# Patient Record
Sex: Male | Born: 1935 | Race: Black or African American | Hispanic: No | Marital: Married | Smoking: Never smoker
Health system: Southern US, Community
[De-identification: ages and names within clinical notes are randomized; demographics above are authoritative.]

## PROBLEM LIST (undated history)

## (undated) DIAGNOSIS — I10 Essential (primary) hypertension: Secondary | ICD-10-CM

## (undated) DIAGNOSIS — E7800 Pure hypercholesterolemia, unspecified: Secondary | ICD-10-CM

## (undated) HISTORY — PX: FOOT SURGERY: SHX648

## (undated) HISTORY — DX: Essential (primary) hypertension: I10

## (undated) HISTORY — DX: Pure hypercholesterolemia, unspecified: E78.00

---

## 2004-04-02 ENCOUNTER — Ambulatory Visit: Payer: Self-pay | Admitting: Unknown Physician Specialty

## 2004-10-09 ENCOUNTER — Ambulatory Visit: Payer: Self-pay | Admitting: Internal Medicine

## 2006-11-12 ENCOUNTER — Ambulatory Visit: Payer: Self-pay | Admitting: Internal Medicine

## 2008-04-09 ENCOUNTER — Ambulatory Visit: Payer: Self-pay | Admitting: Urology

## 2009-07-29 ENCOUNTER — Ambulatory Visit: Payer: Self-pay | Admitting: Unknown Physician Specialty

## 2009-07-30 ENCOUNTER — Ambulatory Visit: Payer: Self-pay | Admitting: Unknown Physician Specialty

## 2009-07-31 ENCOUNTER — Ambulatory Visit: Payer: Self-pay | Admitting: Cardiovascular Disease

## 2012-10-06 ENCOUNTER — Emergency Department: Payer: Self-pay | Admitting: Emergency Medicine

## 2012-10-06 LAB — CBC
HCT: 38 % — ABNORMAL LOW (ref 40.0–52.0)
MCH: 27.1 pg (ref 26.0–34.0)
MCHC: 33.3 g/dL (ref 32.0–36.0)
MCV: 81 fL (ref 80–100)
Platelet: 187 10*3/uL (ref 150–440)
RBC: 4.66 10*6/uL (ref 4.40–5.90)
RDW: 14.4 % (ref 11.5–14.5)
WBC: 5 10*3/uL (ref 3.8–10.6)

## 2012-10-06 LAB — BASIC METABOLIC PANEL
Co2: 28 mmol/L (ref 21–32)
Glucose: 106 mg/dL — ABNORMAL HIGH (ref 65–99)
Osmolality: 274 (ref 275–301)
Sodium: 136 mmol/L (ref 136–145)

## 2012-10-06 LAB — TROPONIN I: Troponin-I: <0.02 ng/mL

## 2012-10-06 LAB — CK TOTAL AND CKMB (NOT AT ARMC)
CK, Total: 418 U/L — ABNORMAL HIGH (ref 35–232)
CK-MB: 3.3 ng/mL (ref 0.5–3.6)

## 2013-05-31 ENCOUNTER — Ambulatory Visit (INDEPENDENT_AMBULATORY_CARE_PROVIDER_SITE_OTHER): Payer: Medicare Other

## 2013-05-31 ENCOUNTER — Encounter: Payer: Self-pay | Admitting: Podiatry

## 2013-05-31 ENCOUNTER — Ambulatory Visit (INDEPENDENT_AMBULATORY_CARE_PROVIDER_SITE_OTHER): Payer: Medicare Other | Admitting: Podiatry

## 2013-05-31 VITALS — Resp 16 | Ht 71.0 in | Wt 220.0 lb

## 2013-05-31 DIAGNOSIS — M79609 Pain in unspecified limb: Secondary | ICD-10-CM

## 2013-05-31 DIAGNOSIS — M79673 Pain in unspecified foot: Secondary | ICD-10-CM

## 2013-05-31 DIAGNOSIS — M2040 Other hammer toe(s) (acquired), unspecified foot: Secondary | ICD-10-CM

## 2013-05-31 NOTE — Progress Notes (Signed)
   Subjective:    Patient ID: Charles Shelton, male    DOB: 03/14/1934, 79 y.o.   MRN: [id number]  HPI Comments: The pain on my little toe on my right foot is killing me. Its been hurting for 6 or so months. It came up slowly and now its getting worse. i used a corn pad and it didn't do any good.   Foot Pain      Review of Systems  Gastrointestinal: Positive for constipation.  All other systems reviewed and are negative.      Objective:   Physical Exam: I have reviewed his past medical history medications allergies surgeries social history and review of systems. Pulses are strongly palpable bilateral. Neurologic sensorium is intact per since once the monofilament deep tendon reflexes are intact and brisk bilateral. Muscle strength is 5 over 5 dorsiflexors plantar flexors inverters everters all intrinsic musculature is intact. Orthopedic evaluation demonstrates all joints distal to the ankle a full range of motion without crepitus. He has adductovarus rotated hammertoe deformity fifth bilateral. Cutaneous evaluation demonstrates supple well hydrated cutis with exception of her reactive hyperkeratotic lesion/porokeratosis lateral aspect of the fifth digit of the right foot. Radiographic evaluation does demonstrate a small exostosis to the distal phalanx laterally. This is more than likely responsible for his symptomatology.        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  Plan: Debrided reactive hyperkeratosis and placed padding fifth digit right foot. Discussed the possible need for derotational arthroplasty fifth digit right foot with lateral exostectomy.

## 2013-07-19 ENCOUNTER — Encounter: Payer: Self-pay | Admitting: Podiatry

## 2013-07-19 ENCOUNTER — Ambulatory Visit (INDEPENDENT_AMBULATORY_CARE_PROVIDER_SITE_OTHER): Payer: Medicare Other | Admitting: Podiatry

## 2013-07-19 DIAGNOSIS — M2040 Other hammer toe(s) (acquired), unspecified foot: Secondary | ICD-10-CM

## 2013-07-19 DIAGNOSIS — Q828 Other specified congenital malformations of skin: Secondary | ICD-10-CM

## 2013-07-19 NOTE — Progress Notes (Signed)
Malachai presents today chief complaint of a painful fifth digit of the right foot.  Objective: Pulses are palpable right foot. Adductovarus rotated hammertoe deformity with lateral porokeratotic lesion DIPJ right foot. No infection noted.  Assessment: Adductovarus rotated hammertoe deformity with exostosis lateral aspect resulting in porokeratotic lesion.  Plan: Debridement of reactive hyperkeratotic lesion placed padding discussed surgical intervention with him we'll followup with him in November for consult.

## 2013-12-11 ENCOUNTER — Ambulatory Visit (INDEPENDENT_AMBULATORY_CARE_PROVIDER_SITE_OTHER): Payer: Medicare Other | Admitting: Podiatry

## 2013-12-11 VITALS — BP 133/72 | HR 65 | Resp 16

## 2013-12-11 DIAGNOSIS — M2041 Other hammer toe(s) (acquired), right foot: Secondary | ICD-10-CM

## 2013-12-11 DIAGNOSIS — Q828 Other specified congenital malformations of skin: Secondary | ICD-10-CM

## 2013-12-11 NOTE — Progress Notes (Signed)
He presents today complaining of a painful lesion to the fifth digit of the right foot. He states that he would like to see about getting this surgically corrected at all possible. He denies any changes in his past medical history medications allergies or social history.  Objective: Vital signs are stable he is alert and oriented 3. Pulses are strongly palpable. Neurologic sensorium intact. Adductovarus rotated hammertoe deformity fifth right with distal lateral exostosis resulting in a porokeratotic lesion laterally.  Assessment: Adductovarus rotated hammertoe deformity with lateral exostosis.  Plan: Discussed etiology pathology conservative versus surgical therapies. Debrided reactive hyperkeratosis for him today. I consented him for a derotational arthroplasty fifth digit right foot with lateral exostectomy fifth right. I answered all of the questions regarding this procedure to the best of my ability in layman's terms. He understood it was amenable to it and signed all 3 pages of the consent form. We did discuss the possible postop complications which may include but are not limited to postop pain bleeding swelling infection and recurrence. I will follow-up with him after Thanksgiving.

## 2014-01-11 ENCOUNTER — Other Ambulatory Visit: Payer: Self-pay | Admitting: Podiatry

## 2014-01-11 MED ORDER — CEPHALEXIN 500 MG PO CAPS: 500.0000 mg | ORAL_CAPSULE | Freq: Three times a day (TID) | ORAL | Status: DC

## 2014-01-11 MED ORDER — PROMETHAZINE HCL 25 MG PO TABS: 25.0000 mg | ORAL_TABLET | Freq: Three times a day (TID) | ORAL | Status: DC | PRN

## 2014-01-11 MED ORDER — OXYCODONE-ACETAMINOPHEN 10-325 MG PO TABS: ORAL_TABLET | ORAL | Status: DC

## 2014-01-12 ENCOUNTER — Encounter: Payer: Self-pay | Admitting: Podiatry

## 2014-01-12 DIAGNOSIS — M2570 Osteophyte, unspecified joint: Secondary | ICD-10-CM

## 2014-01-12 DIAGNOSIS — M2041 Other hammer toe(s) (acquired), right foot: Secondary | ICD-10-CM

## 2014-01-17 ENCOUNTER — Ambulatory Visit (INDEPENDENT_AMBULATORY_CARE_PROVIDER_SITE_OTHER): Payer: Medicare Other | Admitting: Podiatry

## 2014-01-17 ENCOUNTER — Ambulatory Visit (INDEPENDENT_AMBULATORY_CARE_PROVIDER_SITE_OTHER): Payer: Medicare Other

## 2014-01-17 VITALS — BP 140/81 | HR 64 | Resp 16

## 2014-01-17 DIAGNOSIS — M2041 Other hammer toe(s) (acquired), right foot: Secondary | ICD-10-CM

## 2014-01-17 DIAGNOSIS — Z9889 Other specified postprocedural states: Secondary | ICD-10-CM

## 2014-01-17 NOTE — Progress Notes (Signed)
Charles Shelton presents today for fu surgical toe fifth right.  Denies fever chills nausea and vomitting.  Having very little pain.  Objective:  Vitals stable.  Dressing intact. Once removed demonstrates no erythema, no edema and no drainage. Radiographs demonstrate complete arthroplasty 5th toe at pipj  Assessment: well healing surgical toe fifth right x one week  Plan:  Redress today and followup one week with Dr Ardelle AntonWagoner for suture removal.

## 2014-01-25 ENCOUNTER — Ambulatory Visit (INDEPENDENT_AMBULATORY_CARE_PROVIDER_SITE_OTHER): Payer: Medicare Other | Admitting: Podiatry

## 2014-01-25 ENCOUNTER — Encounter: Payer: Self-pay | Admitting: Podiatry

## 2014-01-25 VITALS — BP 117/64 | HR 66 | Resp 16

## 2014-01-25 DIAGNOSIS — Z9889 Other specified postprocedural states: Secondary | ICD-10-CM

## 2014-01-25 DIAGNOSIS — M2041 Other hammer toe(s) (acquired), right foot: Secondary | ICD-10-CM

## 2014-01-25 NOTE — Progress Notes (Signed)
Patient ID: Charles Shelton, male   DOB: 05/08/1934, 79 y.o.   MRN: [id number]  Subjective: 79 year old male returns the office today  For follow-up evaluation status post right foot fifth digit hammertoe repair. The patient has continued wearing the surgical shoe and he denies any pain at this time. He has no other complaints in no acute changes since last appointment. Denies any systemic complaints such as fevers, chills, nausea, vomiting.  Objective:  AAO 3, NAD  DP/PT pulses palpable,  CRT less than 3 seconds  Protective sensation intact with Simms Weinstein monofilament  Incision along the fifth digit is well coapted without any evidence of dehiscence and sutures are intact. There is no swelling erythema, edema, increase in warmth. There is no tenderness overlying the surgical site. There is no clinical signs of infection present.  No pain with calf compression, swelling, warmth, erythema.   Assessment:  79 year old male status post right fifth digit hammertoe repair,  Healing well.   Plan: - Treatment options were discussed the patient including alternatives, risks, complications. - Sutures removed without complications. Incision well coapted. - Discussed the patient that he is started to get the area wet however he keep incision clean  And he can apply anabolic ointment overlying the incision. - He can start to return to a regular sneaker as long as it is not pressing on the toe/incision as tolerated. If there is any discomfort to go back into the surgical shoe. - Showed the patient how to tape the toe - Follow-up in 2 weeks without her Hyatt or sooner if any palms should arise. In the meantime, call the office with any questions, concerns, change in symptoms.

## 2014-01-31 NOTE — Progress Notes (Signed)
Dr Al CorpusHyatt performed a right foot devotational arthroplasty 5th toe and exostectomy 5th toe right foot on 01/12/14

## 2014-02-12 ENCOUNTER — Encounter: Payer: Medicare Other | Admitting: Podiatry

## 2014-02-13 ENCOUNTER — Ambulatory Visit (INDEPENDENT_AMBULATORY_CARE_PROVIDER_SITE_OTHER): Payer: Medicare Other

## 2014-02-13 ENCOUNTER — Encounter: Payer: Self-pay | Admitting: Podiatry

## 2014-02-13 ENCOUNTER — Ambulatory Visit (INDEPENDENT_AMBULATORY_CARE_PROVIDER_SITE_OTHER): Payer: Medicare Other | Admitting: Podiatry

## 2014-02-13 VITALS — BP 144/80 | HR 60 | Resp 16

## 2014-02-13 DIAGNOSIS — Z9889 Other specified postprocedural states: Secondary | ICD-10-CM

## 2014-02-13 DIAGNOSIS — M2041 Other hammer toe(s) (acquired), right foot: Secondary | ICD-10-CM

## 2014-02-13 NOTE — Progress Notes (Signed)
Patient ID: Charles Shelton, male   DOB: 07/31/1934, 80 y.o.   MRN: [id number]  Subjective: 80 year old male returns to the office status post right 5th digit PIPJ arthroplasty. He states overall he is doing well. He has a tender area on the "bottom of the toe". He is able to wear regular shoes without any difficulty. Denies any systemic complaints such as fevers, chills, nausea, vomiting. No acute changes since last appointment, and no other complaints at this time.   Objective: AAO x3, NAD DP/PT pulses palpable bilaterally, CRT less than 3 seconds Protective sensation intact with Simms Weinstein monofilament Incision along the 5th digit on the right foot is well coapted without any dehiscence. There is no tenderness to the overlying the incision. On the distal lateral aspect of the right fifth digit there is a hyperkeratotic lesion in which subjective the patient has some discomfort. 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 Upon debridement there is no underlying ulceration or any clinical signs of infection. No other open lesions or pre-ulcerative lesions. There is no specific areas of pinpoint bony tenderness. There is slight edema overlying the right fifth digit. No pain with calf compression, swelling, warmth, erythema.  Assessment: 80 year old male status post right fifth digit PIPJ arthroplasty with hyperkeratotic lesion distal lateral digit  Plan: -All treatment options discussed with the patient including all alternatives, risks, complications.  -Hyperkeratotic lesion was sharply debrided without complications to patient comfort. 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 Directed to apply a small amount of anabolic ointment as well as a Band-Aid to the area. -Dispensed a small toe pad to wear if needed. -Discussed the patient he can slowly start to return to work. He will start out with half days  and increase his activity as tolerated. Discussed with him that if he has any increasing symptoms while increasing his activity to decrease the activity. - follow-up in one month with Dr. Al CorpusHyatt or sooner should any palms arise. In the meantime, patient encouraged to call the office with any questions, concerns, change in symptoms.

## 2014-02-16 ENCOUNTER — Observation Stay: Payer: Self-pay | Admitting: Internal Medicine

## 2014-02-16 LAB — CBC WITH DIFFERENTIAL/PLATELET
BASOS PCT: 0.5 %
Basophil #: 0 10*3/uL (ref 0.0–0.1)
EOS PCT: 0.6 %
Eosinophil #: 0 10*3/uL (ref 0.0–0.7)
HCT: 38.8 % — ABNORMAL LOW (ref 40.0–52.0)
HGB: 12.2 g/dL — ABNORMAL LOW (ref 13.0–18.0)
LYMPHS PCT: 19.6 %
Lymphocyte #: 1.5 10*3/uL (ref 1.0–3.6)
MCH: 26.5 pg (ref 26.0–34.0)
MCHC: 31.5 g/dL — ABNORMAL LOW (ref 32.0–36.0)
MCV: 84 fL (ref 80–100)
MONOS PCT: 6.9 %
Monocyte #: 0.5 x10 3/mm (ref 0.2–1.0)
NEUTROS PCT: 72.4 %
Neutrophil #: 5.7 10*3/uL (ref 1.4–6.5)
PLATELETS: 190 10*3/uL (ref 150–440)
RBC: 4.61 10*6/uL (ref 4.40–5.90)
RDW: 13.9 % (ref 11.5–14.5)
WBC: 7.9 10*3/uL (ref 3.8–10.6)

## 2014-02-16 LAB — BASIC METABOLIC PANEL
Anion Gap: 7 (ref 7–16)
BUN: 13 mg/dL (ref 7–18)
CALCIUM: 8.8 mg/dL (ref 8.5–10.1)
CHLORIDE: 102 mmol/L (ref 98–107)
CO2: 30 mmol/L (ref 21–32)
CREATININE: 1.22 mg/dL (ref 0.60–1.30)
EGFR (African American): >60
EGFR (Non-African Amer.): >60
Glucose: 154 mg/dL — ABNORMAL HIGH (ref 65–99)
Osmolality: 281 (ref 275–301)
POTASSIUM: 3.6 mmol/L (ref 3.5–5.1)
SODIUM: 139 mmol/L (ref 136–145)

## 2014-02-16 LAB — TROPONIN I: Troponin-I: <0.02 ng/mL

## 2014-02-17 LAB — CBC WITH DIFFERENTIAL/PLATELET
BASOS PCT: 0.4 %
Basophil #: 0 10*3/uL (ref 0.0–0.1)
EOS ABS: 0.1 10*3/uL (ref 0.0–0.7)
Eosinophil %: 1.8 %
HCT: 37.7 % — ABNORMAL LOW (ref 40.0–52.0)
HGB: 12.1 g/dL — AB (ref 13.0–18.0)
LYMPHS PCT: 46.8 %
Lymphocyte #: 2.7 10*3/uL (ref 1.0–3.6)
MCH: 26.9 pg (ref 26.0–34.0)
MCHC: 32.2 g/dL (ref 32.0–36.0)
MCV: 84 fL (ref 80–100)
MONOS PCT: 10.1 %
Monocyte #: 0.6 x10 3/mm (ref 0.2–1.0)
NEUTROS PCT: 40.9 %
Neutrophil #: 2.4 10*3/uL (ref 1.4–6.5)
Platelet: 169 10*3/uL (ref 150–440)
RBC: 4.51 10*6/uL (ref 4.40–5.90)
RDW: 13.9 % (ref 11.5–14.5)
WBC: 5.8 10*3/uL (ref 3.8–10.6)

## 2014-02-17 LAB — BASIC METABOLIC PANEL
ANION GAP: 4 — AB (ref 7–16)
BUN: 12 mg/dL (ref 7–18)
Calcium, Total: 8.9 mg/dL (ref 8.5–10.1)
Chloride: 104 mmol/L (ref 98–107)
Co2: 29 mmol/L (ref 21–32)
Creatinine: 1.08 mg/dL (ref 0.60–1.30)
EGFR (Non-African Amer.): >60
GLUCOSE: 102 mg/dL — AB (ref 65–99)
Osmolality: 274 (ref 275–301)
Potassium: 3.4 mmol/L — ABNORMAL LOW (ref 3.5–5.1)
Sodium: 137 mmol/L (ref 136–145)

## 2014-02-17 LAB — LIPID PANEL
Cholesterol: 166 mg/dL (ref 0–200)
HDL Cholesterol: 53 mg/dL (ref 40–60)
LDL CHOLESTEROL, CALC: 94 mg/dL (ref 0–100)
Triglycerides: 93 mg/dL (ref 0–200)
VLDL Cholesterol, Calc: 19 mg/dL (ref 5–40)

## 2014-02-17 LAB — TSH: Thyroid Stimulating Horm: 2.33 u[IU]/mL

## 2014-02-17 LAB — HEMOGLOBIN A1C: Hemoglobin A1C: 6.4 % — ABNORMAL HIGH (ref 4.2–6.3)

## 2014-03-12 ENCOUNTER — Ambulatory Visit (INDEPENDENT_AMBULATORY_CARE_PROVIDER_SITE_OTHER): Payer: Medicare Other

## 2014-03-12 ENCOUNTER — Encounter: Payer: Medicare Other | Admitting: Podiatry

## 2014-03-12 ENCOUNTER — Ambulatory Visit (INDEPENDENT_AMBULATORY_CARE_PROVIDER_SITE_OTHER): Payer: Medicare Other | Admitting: Podiatry

## 2014-03-12 DIAGNOSIS — M2041 Other hammer toe(s) (acquired), right foot: Secondary | ICD-10-CM

## 2014-03-12 NOTE — Progress Notes (Signed)
He presents today several weeks status post arthroplasty with exostectomy fifth toe right foot. He states that he feels pretty good to me. He denies fever chills nausea vomiting muscle aches and pains. States that the toe does not hurt with shoe gear.  Objective: Vital signs are stable he is alert and oriented 3. Pulses are strongly palpable. The toe is mildly edematous there is no erythema cellulitis drainage or odor. He has good range of motion of the toe and it is not painful on palpation radiographs confirm complete arthroplasty of the PIPJ fifth digit right foot without bony regrowth.  Assessment: Well-healing surgical toe fifth right.  Plan: Follow up with him on an as-needed basis.

## 2014-06-10 NOTE — H&P (Signed)
PATIENT NAME:  Charles Shelton, Charles Shelton MR#:  [id number] DATE OF BIRTH:  18-Jan-1935  DATE OF ADMISSION:  02/16/2014  PRIMARY CARE PHYSICIAN: Kandyce Rud, MD  REFERRING EMERGENCY ROOM PHYSICIAN: Cecille Amsterdam. Mayford Knife, MD  CHIEF COMPLAINT: Ataxia.   HISTORY OF PRESENT ILLNESS: The patient is a 80 year old African American male who came into the ED with a chief complaint of unsteady gait since yesterday evening. The patient was swaying while walking, and doing fine if he is resting. This started yesterday night and progressively has been getting worse. Denies any loss of consciousness or falls. No similar complaints in the past. Denies any headache, blurry vision, no weakness or tingling or numbness in her upper or lower extremities. Denies any swallowing difficulties or speech problems.   In the ED, CAT scan of the head was done, which was normal. The patient was given aspirin, and the hospitalist team was called to admit the patient to rule out TIA.   During my examination, the patient is resting comfortably with no complaints. He reports he is doing fine as long as he is staying in the bed.   PAST MEDICAL HISTORY: Hypertension, hyperlipidemia, obesity.   PAST SURGICAL HISTORY: Right toe surgery.   ALLERGIES: No known drug allergies.   PSYCHOSOCIAL HISTORY: Lives at home with wife. No smoking, alcohol or illicit drug usage.   FAMILY HISTORY: Hypertension runs in his family.   HOME MEDICATIONS:,: Potassium gluconate 1 tablet p.o. once daily, multivitamin 1 tablet p.o. once daily, hydrochlorothiazide 25 mg 1/2 tablet p.o. once daily, Diovan HCT 160/12.5 one tablet p.o. once daily, Crestor 5 mg p.o. once daily, aspirin 81 mg p.o. once daily.   REVIEW OF SYSTEMS:  CONSTITUTIONAL: Denies any fever.  EYES: Denies blurry vision or double vision.  EARS, NOSE, AND THROAT: Denies epistaxis, discharge.  RESPIRATORY: Denies cough or COPD.  CARDIOVASCULAR: No chest pain, palpitations, or syncope.   GASTROINTESTINAL: Denies nausea, vomiting, diarrhea, abdominal pain, hematemesis.  GENITOURINARY: No dysuria or hematuria.  ENDOCRINE: Denies polyuria, nocturia, thyroid problems.  HEMATOLOGIC AND LYMPHATIC: No anemia, easy bruising, or bleeding.  INTEGUMENT: Denies rash or lesions.  MUSCULOSKELETAL: No joint pain in the neck or back. Denies any shoulder pain.  NEUROLOGIC: Denies any vertigo or complaints of ataxia. No dysarthria. Denies any dementia. No CVA or TIA. PSYCHIATRIC: Normal mood and affect.   PHYSICAL EXAMINATION:  VITAL SIGNS:  Temperature 98.1, pulse 60, respirations 18, blood pressure 148/74, pulse oximetry 98% on 2 L.  GENERAL APPEARANCE: Not in acute distress. Moderately built and nourished.  HEENT: Normocephalic, atraumatic. Pupils are equally reacting to light and accommodation. No scleral icterus. No conjunctival injection. No sinus tenderness. No postnasal drip.  NECK: Supple. No JVD. No thyromegaly. Range of motion is intact.  LUNGS: Clear to auscultation bilaterally. No accessory muscle use. There is no anterior chest wall tenderness on palpation.  CARDIAC: S1 and S2 normal. Regular rate and rhythm. No murmurs.  GASTROINTESTINAL: Soft. Bowel sounds are positive in all 4 quadrants. Nontender, nondistended. No hepatosplenomegaly. No masses felt.  NEUROLOGIC: Awake, alert, oriented x 3. Cranial nerves II through XII are grossly intact. Motor and sensory are intact. Reflexes are 2+. Gait unsteady with ataxia. No pronator drift.  EXTREMITIES: No edema. No cyanosis. No clubbing.  SKIN: Warm. Normal turgor. No rashes. No lesions.  PSYCHIATRIC: Normal mood and affect.   LABORATORIES AND IMAGING STUDIES: CT head normal. Troponin less than 0.02. WBC 7.9, hemoglobin 12.2, hematocrit 38.8, platelets are 190,000. Glucose 154. The rest of the  BMP is normal. Twelve-lead EKG: Normal sinus rhythm at 65 beats per minute; no acute ST-T wave changes.   ASSESSMENT AND PLAN: This  80 year old African American male presented to the emergency department with a chief complaint of unsteady gait since yesterday. Denies any falls or loss of consciousness. Initial CT head is negative.   1.  Ataxia versus transient ischemic attack. We will admit the patient to medical telemetry under observation status. CAT scan of the head is negative. Will get stroke workup with carotid Dopplers, 2-D echocardiogram and MRI of the brain. The patient will be on aspirin and statin. He will get neurologic checks. Physical therapy evaluation will be done if necessary, per the neurology consult.  2.  Hypotension. Resume his home medications and up titrate on an as-needed basis. The patient will be on Diovan/HCT and hydrochlorothiazide.  3.  Hyperlipidemia. We will continue statin and check fasting lipid panel.  4.  Deep vein thrombosis prophylaxis with Lovenox subcutaneous.  5.  Obesity. Lifestyle changes were advised.   Diagnosis and plan of care were discussed in detail with the patient. He is aware of the plan.  TOTAL TIME SPENT ON THE ADMISSION: 50 minutes.   He is Full Code. Wife is the Medical Power of ByarsAttorney.   ____________________________ Ramonita LabAruna Vivi Piccirilli, MD ag:MT D: 02/16/2014 16:03:16 ET T: 02/16/2014 16:36:45 ET JOB#: [id number]  cc: Ramonita LabAruna Egan Sahlin, MD, <Dictator> Kandyce RudMarcus Babaoff, MD Ramonita LabARUNA Irlene Crudup MD ELECTRONICALLY SIGNED 02/24/2014 15:03

## 2014-06-10 NOTE — Discharge Summary (Signed)
PATIENT NAME:  Charles Shelton, Charles Shelton MR#:  [id number] DATE OF BIRTH:  01-17-1935  DATE OF ADMISSION:  02/16/2014 DATE OF DISCHARGE:  02/17/2014  ADMITTING DIAGNOSIS: Transient ischemic attack.  DISCHARGE DIAGNOSES: 1.  Ataxia, likely transient ischemic attack versus hypertensive encephalopathy, resolved. 2.  No stroke.  3.  Essential hypertension.  4.  Hyperlipidemia.  5.  Hyperglycemia with hemoglobin A1c of 6.4. 6.  Hyperlipidemia with LDL 94, total cholesterol 045, triglycerides 93, and HDL 53.  7.  Anemia.  8.  History of hypertension, hyperlipidemia, as well as obesity.   DISCHARGE CONDITION: Stable.   DISCHARGE MEDICATIONS: The patient is to continue Diovan HCT 160/12.5 mg p.o. daily; hydrochlorothiazide 25 mg p.o. half tablet daily; multivitamins once daily; potassium gluconate 595 mg once daily; Crestor 5 mg daily, this is a new dose; and aspirin 325 mg p.o. daily.   HOME OXYGEN: None.   DIET: Low-salt, low-fat, low-cholesterol, regular consistency.  ACTIVITY LIMITATIONS: As tolerated.  FOLLOWUP: Followup appointment with Dr. Larwance Sachs in 2 days after discharge.   CONSULTANTS: Case management, social work.   RADIOLOGIC STUDIES: MRI of brain with and without contrast and MRA of brain without contrast 02/16/2014 showed atrophy and small-vessel disease. No acute intracranial findings. Cervical spondylosis was not completely evaluated. If there are  signs and symptoms of myelopathy, consider MRI of cervical spine. Unremarkable MRA of intracranial arteries. CT scan of the head without contrast 02/16/2014 was negative for any abnormalities. Carotid ultrasound, bilateral Dopplers 02/16/2014 revealing no significant plaque or other abnormality. Echocardiogram 02/17/2014 revealing left ventricular ejection fraction by visual estimation of 55% to 60%, normal global left ventricular systolic function, mildly dilated left atrium as well as mildly dilated right atrium, mild mitral valve  regurgitation, mild tricuspid regurgitation.   HISTORY OF PRESENT ILLNESS: The patient is a 80 year old male who presented to the hospital with complaints of ataxia. Please refer to Dr. Rob Hickman admission note on 02/16/2014. On arrival to the hospital, the patient's temperature was 98.1, pulse was 60, respiration rate was 18, blood pressure 148/74, saturation was 98% on 2 L of oxygen through nasal cannula. Physical exam revealed gait unsteady with ataxia, but otherwise unremarkable. The patient's lab data done on arrival to the hospital showed elevated glucose level at 154; otherwise BMP was normal. Troponin was less than 0.02. TSH was normal at 2.33. CBC with a white blood cell count of 7.9, hemoglobin 12.2, platelet count 190,000. Absolute neutrophil count was normal at 5.7.  HOSPITAL COURSE: 1.  Patient was admitted to the hospital for further evaluation. Because of ataxia and concern of possible stroke or TIA, the patient was started on high dose of aspirin and a stroke workup was initiated with MRI as well as MRA of his brain and intracranial arteries. Carotid ultrasound was also performed, and echocardiogram. All of those studies did not show significant abnormalities. It was felt that patient's ataxia could have been related to a TIA versus hypertensive encephalopathy. It resolved by 02/17/2014. The patient was advised to continue aspirin therapy. His Crestor was advanced to 5 mg once daily dose. The patient is to continue to follow up with his primary care physician as well as neurologist as outpatient. In regard to essential hypertension, the patient is to continue his usual outpatient management. The patient's blood pressure was better controlled as time progressed. On the day of discharge, 02/17/2014, the patient's temperature was 98.4, pulse was 62, respiration rate was 17 to 18, blood pressure 120/71. Saturation was 96% on room air at  rest. It is recommended to follow the patient's blood pressure  readings as an outpatient, especially at home, and advance his blood pressure medications if needed. 2.  In regard to hyperlipidemia, as mentioned above, patient's lipid panel revealed LDL of 94. Due to his presentation with ataxia and concern of possible TIA, the patient's LDL should be below 100, preferably below 70. Patient's Crestor was advanced to once daily dose as opposed to 3 times weekly dose. 3.  In regard to hyperglycemia, the patient's hemoglobin A1c was checked and was found to be 6.4. The patient was recommended dietary consultation because of his prediabetic state. 4.  Regarding history of anemia, hypertension, obesity, the patient is to continue his outpatient management. No changes were made.  DISPOSITION: The patient is being discharged in stable condition with the above-mentioned medications and followup.   TIME SPENT: 40 minutes.   ____________________________ Katharina Caperima Jamarcus Laduke, MD rv:ST D: 02/17/2014 15:08:37 ET T: 02/18/2014 00:32:43 ET JOB#: [id number]  cc: Katharina Caperima Oseph Imburgia, MD, <Dictator> Kandyce RudMarcus Babaoff, MD Ranon Coven MD ELECTRONICALLY SIGNED 03/01/2014 9:18

## 2015-03-27 ENCOUNTER — Encounter
Admission: RE | Disposition: A | Discharge status: Home / Self Care | Payer: Self-pay | Source: Ambulatory Visit | Attending: Unknown Physician Specialty

## 2015-03-27 ENCOUNTER — Ambulatory Visit
Admission: RE | Admit: 2015-03-27 | Discharge: 2015-03-27 | Disposition: A | Discharge status: Home / Self Care | Payer: Medicare Other | Source: Ambulatory Visit | Attending: Unknown Physician Specialty | Admitting: Unknown Physician Specialty

## 2015-03-27 ENCOUNTER — Encounter: Payer: Self-pay | Admitting: *Deleted

## 2015-03-27 DIAGNOSIS — Z79899 Other long term (current) drug therapy: Secondary | ICD-10-CM | POA: Diagnosis not present

## 2015-03-27 DIAGNOSIS — Z1211 Encounter for screening for malignant neoplasm of colon: Secondary | ICD-10-CM | POA: Insufficient documentation

## 2015-03-27 DIAGNOSIS — I10 Essential (primary) hypertension: Secondary | ICD-10-CM | POA: Diagnosis not present

## 2015-03-27 DIAGNOSIS — Z7982 Long term (current) use of aspirin: Secondary | ICD-10-CM | POA: Diagnosis not present

## 2015-03-27 DIAGNOSIS — E7800 Pure hypercholesterolemia, unspecified: Secondary | ICD-10-CM | POA: Insufficient documentation

## 2015-03-27 DIAGNOSIS — K640 First degree hemorrhoids: Secondary | ICD-10-CM | POA: Insufficient documentation

## 2015-03-27 HISTORY — PX: COLONOSCOPY WITH PROPOFOL: SHX5780

## 2015-03-27 SURGERY — COLONOSCOPY WITH PROPOFOL
Anesthesia: General

## 2015-03-27 MED ORDER — MIDAZOLAM HCL 5 MG/5ML IJ SOLN
INTRAMUSCULAR | Status: AC
  Filled 2015-03-27: qty 10

## 2015-03-27 MED ORDER — FENTANYL CITRATE (PF) 100 MCG/2ML IJ SOLN
INTRAMUSCULAR | Status: DC | PRN
  Administered 2015-03-27 (×2): 25 ug via INTRAVENOUS
  Administered 2015-03-27: 50 ug via INTRAVENOUS

## 2015-03-27 MED ORDER — PROMETHAZINE HCL 25 MG/ML IJ SOLN
12.5000 mg | Freq: Once | INTRAMUSCULAR | Status: AC
  Administered 2015-03-27: 12.5 mg via INTRAVENOUS

## 2015-03-27 MED ORDER — SODIUM CHLORIDE 0.9 % IV SOLN
INTRAVENOUS | Status: DC
  Administered 2015-03-27: 1000 mL via INTRAVENOUS

## 2015-03-27 MED ORDER — FENTANYL CITRATE (PF) 100 MCG/2ML IJ SOLN
INTRAMUSCULAR | Status: AC
  Filled 2015-03-27: qty 4

## 2015-03-27 MED ORDER — SODIUM CHLORIDE 0.9 % IV SOLN: INTRAVENOUS | Status: DC

## 2015-03-27 MED ORDER — MIDAZOLAM HCL 5 MG/5ML IJ SOLN
INTRAMUSCULAR | Status: DC | PRN
  Administered 2015-03-27: 1 mg via INTRAVENOUS
  Administered 2015-03-27: 2 mg via INTRAVENOUS
  Administered 2015-03-27 (×2): 1 mg via INTRAVENOUS
  Administered 2015-03-27: 2 mg via INTRAVENOUS

## 2015-03-27 NOTE — H&P (Signed)
   Primary Care Physician:  Levy Sjogren, NP Primary Gastroenterologist:  Dr. Mechele Collin  Pre-Procedure History & Physical: HPI:  Charles Shelton is a 81 y.o. male is here for an colonoscopy.   Past Medical History  Diagnosis Date  . High cholesterol   . HBP (high blood pressure)     Past Surgical History  Procedure Laterality Date  . Foot surgery      Prior to Admission medications   Medication Sig Start Date End Date Taking? Authorizing Provider  aspirin 81 MG tablet Take 81 mg by mouth daily.    Historical Provider, MD  aspirin EC 81 MG tablet Take by mouth.    Historical Provider, MD  cephALEXin (KEFLEX) 500 MG capsule Take 1 capsule (500 mg total) by mouth 3 (three) times daily. Patient not taking: Reported on 03/12/2014 01/11/14   Max T Hyatt, DPM  CRESTOR 5 MG tablet  05/03/13   Historical Provider, MD  hydrochlorothiazide (HYDRODIURIL) 25 MG tablet  04/22/13   Historical Provider, MD  hydrochlorothiazide (HYDRODIURIL) 25 MG tablet  04/22/13   Historical Provider, MD  Multiple Vitamin (MULTI-VITAMINS) TABS Take by mouth.    Historical Provider, MD  rosuvastatin (CRESTOR) 5 MG tablet  05/03/13   Historical Provider, MD  valsartan-hydrochlorothiazide (DIOVAN-HCT) 160-12.5 MG per tablet  05/19/13   Historical Provider, MD  valsartan-hydrochlorothiazide (DIOVAN-HCT) 160-12.5 MG per tablet Take by mouth. 07/18/13 08/17/13  Historical Provider, MD    Allergies as of 03/18/2015  . (No Known Allergies)    History reviewed. No pertinent family history.  Social History   Social History  . Marital Status: Married    Spouse Name: N/A  . Number of Children: N/A  . Years of Education: N/A   Occupational History  . Not on file.   Social History Main Topics  . Smoking status: Never Smoker   . Smokeless tobacco: Not on file  . Alcohol Use: No  . Drug Use: Not on file  . Sexual Activity: Not on file   Other Topics Concern  . Not on file   Social History Narrative    Review  of Systems: See HPI, otherwise negative ROS  Physical Exam: BP 160/72 mmHg  Pulse 63  Temp(Src) 98 F (36.7 C) (Oral)  Resp 18  Ht  (1.803 m)  Wt 95.255 kg (210 lb)  BMI 29.30 kg/m2  SpO2 100% General:   Alert,  pleasant and cooperative in NAD Head:  Normocephalic and atraumatic. Neck:  Supple; no masses or thyromegaly. Lungs:  Clear throughout to auscultation.    Heart:  Regular rate and rhythm. Abdomen:  Soft, nontender and nondistended. Normal bowel sounds, without guarding, and without rebound.   Neurologic:  Alert and  oriented x4;  grossly normal neurologically.  Impression/Plan: KEYLAN COSTABILE is here for an colonoscopy to be performed for screening colonoscopy  Risks, benefits, limitations, and alternatives regarding  colonoscopy have been reviewed with the patient.  Questions have been answered.  All parties agreeable.   Lynnae Prude, MD  03/27/2015, 9:53 AM

## 2015-03-27 NOTE — Op Note (Signed)
[facility] Patient Name: Charles Shelton Procedure Date: 03/27/2015 9:55 AM MRN: [id number] Account #: [id number] Date of Birth: [birth year] Admit Type: Outpatient Age: 81 Room: [facility] ENDO ROOM 4 Gender: Male Note Status: Finalized Procedure:            Colonoscopy Indications:          Screening for colorectal malignant neoplasm Providers:            Scot Jun, MD Referring MD:         Gwendalyn Ege (Referring MD) Medicines:            Fentanyl 100 micrograms IV, Midazolam 7 mg IV,                        Promethazine 12.5 mg IV Complications:        No immediate complications. Procedure:            Pre-Anesthesia Assessment:                       - After reviewing the risks and benefits, the patient                        was deemed in satisfactory condition to undergo the                        procedure.                       After obtaining informed consent, the colonoscope was                        passed under direct vision. Throughout the procedure,                        the patient's blood pressure, pulse, and oxygen                        saturations were monitored continuously. The                        Colonoscope was introduced through the anus and                        advanced to the the cecum, identified by appendiceal                        orifice and ileocecal valve. The colonoscopy was                        performed with difficulty due to poor bowel prep.                        Successful completion of the procedure was aided by                        lavage. The patient tolerated the procedure well. Findings:      Internal hemorrhoids were found during endoscopy. The hemorrhoids were       small and Grade I (internal hemorrhoids that do not prolapse). Prep was       relatively poor and requird about 6-7  liters of lavage to see the colon.       No polyps seen but small ones could easily be missed due to the  prep. Impression:           - Internal hemorrhoids.                       - No specimens collected. Recommendation:       - The findings and recommendations were discussed with                        the patient's family. No repeat recommended due to age. Scot Jun, MD 03/27/2015 11:08:02 AM This report has been signed electronically. Number of Addenda: 0 Note Initiated On: 03/27/2015 9:55 AM Scope Withdrawal Time: 0 hours 20 minutes 28 seconds  Total Procedure Duration: 0 hours 58 minutes 41 seconds       [facility]

## 2015-03-31 ENCOUNTER — Encounter: Payer: Self-pay | Admitting: Unknown Physician Specialty

## 2015-10-29 ENCOUNTER — Emergency Department
Admission: EM | Admit: 2015-10-29 | Discharge: 2015-10-29 | Disposition: A | Discharge status: Home / Self Care | Payer: Medicare Other | Attending: Emergency Medicine | Admitting: Emergency Medicine

## 2015-10-29 ENCOUNTER — Emergency Department: Payer: Medicare Other

## 2015-10-29 ENCOUNTER — Encounter: Payer: Self-pay | Admitting: Emergency Medicine

## 2015-10-29 DIAGNOSIS — Z7982 Long term (current) use of aspirin: Secondary | ICD-10-CM | POA: Insufficient documentation

## 2015-10-29 DIAGNOSIS — X501XXA Overexertion from prolonged static or awkward postures, initial encounter: Secondary | ICD-10-CM | POA: Insufficient documentation

## 2015-10-29 DIAGNOSIS — M25462 Effusion, left knee: Secondary | ICD-10-CM

## 2015-10-29 DIAGNOSIS — S8392XA Sprain of unspecified site of left knee, initial encounter: Secondary | ICD-10-CM

## 2015-10-29 DIAGNOSIS — Z79899 Other long term (current) drug therapy: Secondary | ICD-10-CM | POA: Diagnosis not present

## 2015-10-29 DIAGNOSIS — S8992XA Unspecified injury of left lower leg, initial encounter: Secondary | ICD-10-CM | POA: Diagnosis present

## 2015-10-29 DIAGNOSIS — Y929 Unspecified place or not applicable: Secondary | ICD-10-CM | POA: Diagnosis not present

## 2015-10-29 DIAGNOSIS — M1712 Unilateral primary osteoarthritis, left knee: Secondary | ICD-10-CM | POA: Insufficient documentation

## 2015-10-29 DIAGNOSIS — Y999 Unspecified external cause status: Secondary | ICD-10-CM | POA: Diagnosis not present

## 2015-10-29 DIAGNOSIS — Y9389 Activity, other specified: Secondary | ICD-10-CM | POA: Insufficient documentation

## 2015-10-29 MED ORDER — NAPROXEN 500 MG PO TABS: 500.0000 mg | ORAL_TABLET | Freq: Two times a day (BID) | ORAL | 0 refills | Status: AC

## 2015-10-29 MED ORDER — KETOROLAC TROMETHAMINE 30 MG/ML IJ SOLN
30.0000 mg | Freq: Once | INTRAMUSCULAR | Status: AC
  Administered 2015-10-29: 30 mg via INTRAMUSCULAR
  Filled 2015-10-29: qty 1

## 2015-10-29 NOTE — ED Provider Notes (Signed)
[facility] Emergency Department Provider Note  ____________________________________________  Time seen: Approximately 9:43 AM  I have reviewed the triage vital signs and the nursing notes.   HISTORY  Chief Complaint Knee Pain    HPI Charles Shelton is a 81 y.o. male , NAD, presents to the emergency department with 1 day history of left knee pain and swelling. States he was getting out of the truck yesterday, was bearing most of his weight on the left lower extremity and his knee "gave out". Patient states he would have fallen to the ground if he had not grabbed onto a wall that was nearby. Has not had any issues with the left knee in the past other than occasional "popping". Denies any chest pain, shortness of breath, visual changes, dizziness to cause the incident. Has not noted any redness or warmth to the knee or lower extremity. Does note that the left knee is swollen. No fever, chills, fatigue. Has no pain at rest but notes significant pain when he attempts to weight-bear.    Past Medical History:  Diagnosis Date  . HBP (high blood pressure)   . High cholesterol     There are no active problems to display for this patient.   Past Surgical History:  Procedure Laterality Date  . COLONOSCOPY WITH PROPOFOL N/A 03/27/2015   Procedure: COLONOSCOPY WITH PROPOFOL;  Surgeon: Scot Jun, MD;  Location: [facility] ENDOSCOPY;  Service: Endoscopy;  Laterality: N/A;  . FOOT SURGERY      Prior to Admission medications   Medication Sig Start Date End Date Taking? Authorizing Provider  aspirin 81 MG tablet Take 81 mg by mouth daily.    Historical Provider, MD  aspirin EC 81 MG tablet Take by mouth.    Historical Provider, MD  CRESTOR 5 MG tablet  05/03/13   Historical Provider, MD  hydrochlorothiazide (HYDRODIURIL) 25 MG tablet  04/22/13   Historical Provider, MD  hydrochlorothiazide (HYDRODIURIL) 25 MG tablet  04/22/13   Historical Provider, MD  Multiple Vitamin  (MULTI-VITAMINS) TABS Take by mouth.    Historical Provider, MD  naproxen (NAPROSYN) 500 MG tablet Take 1 tablet (500 mg total) by mouth 2 (two) times daily with a meal. 10/29/15   Jami L Hagler, PA-C  rosuvastatin (CRESTOR) 5 MG tablet  05/03/13   Historical Provider, MD  valsartan-hydrochlorothiazide (DIOVAN-HCT) 160-12.5 MG per tablet  05/19/13   Historical Provider, MD  valsartan-hydrochlorothiazide (DIOVAN-HCT) 160-12.5 MG per tablet Take by mouth. 07/18/13 08/17/13  Historical Provider, MD    Allergies Review of patient's allergies indicates no known allergies.  No family history on file.  Social History Social History  Substance Use Topics  . Smoking status: Never Smoker  . Smokeless tobacco: Never Used  . Alcohol use No     Review of Systems  Constitutional: No fever/chills Eyes: No visual changes.  Cardiovascular: No chest pain. Respiratory: No shortness of breath. No wheezing.  Gastrointestinal: No abdominal pain.  No nausea, vomiting.   Musculoskeletal: Positive left knee pain. Negative for left hip, lower extremity pain.  Skin: Positive swelling left knee. Negative for rash, redness, bruising, open wounds or lacerations. Neurological: Negative for numbness, weakness, tingling. 10-point ROS otherwise negative.  ____________________________________________   PHYSICAL EXAM:  VITAL SIGNS: ED Triage Vitals  Enc Vitals Group     BP 10/29/15 0931 134/69     Pulse Rate 10/29/15 0929 66     Resp 10/29/15 0929 16     Temp 10/29/15 0929 98.2 F (  36.8 C)     Temp Source 10/29/15 0929 Oral     SpO2 10/29/15 0929 99 %     Weight 10/29/15 0930 210 lb (95.3 kg)     Height 10/29/15 0930 5\' 11"  (1.803 m)     Head Circumference --      Peak Flow --      Pain Score 10/29/15 0930 0     Pain Loc --      Pain Edu? --      Excl. in GC? --      Constitutional: Alert and oriented. Well appearing and in no acute distress. Eyes: Conjunctivae are normal Without icterus or injection   Head: Atraumatic. Cardiovascular: Good peripheral circulation with 2+ pulses noted in the left lower extremity. Respiratory: Normal respiratory effort without tachypnea or retractions.  Musculoskeletal: Tenderness to palpation about the medial left knee without bony abnormality or crepitus. Decreased range of motion of the left knee with flexion due to pain. Full extension and can be achieved without pain. Negative patellofemoral grind test. No laxity with anterior or posterior drawer. No laxity with varus or valgus stress. No lower extremity tenderness nor edema.  Mild swelling is noted about the medial left knee without significant effusion noted. Neurologic:  Normal speech and language. No gross focal neurologic deficits are appreciated.  Skin:  Skin is warm, dry and intact. No rash, redness, abnormal warmth, bruising, skin sores, open wounds noted. Psychiatric: Mood and affect are normal. Speech and behavior are normal. Patient exhibits appropriate insight and judgement.   ____________________________________________   LABS  None ____________________________________________  EKG  None ____________________________________________  RADIOLOGY I have personally viewed and evaluated these images (plain radiographs) as part of my medical decision making, as well as reviewing the written report by the radiologist.  Dg Knee Complete 4 Views Left  Result Date: 10/29/2015 CLINICAL DATA:  Instability of the left knee, some pain EXAM: LEFT KNEE - COMPLETE 4+ VIEW COMPARISON:  None. FINDINGS: There is mild tricompartmental degenerative joint disease of the knees primarily involving the medial compartment where there is slightly more loss of joint space. No fracture is seen. However on the lateral view, there does appear to be a small left knee joint effusion present. Also, there may be a loose body within the joint space. IMPRESSION: 1. 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 2. Small left knee joint effusion. 3. Probable loose body in the joint space. Electronically Signed   By: Dwyane DeePaul  Barry M.D.   On: 10/29/2015 09:55    ____________________________________________    PROCEDURES  Procedure(s) performed: None   Procedures   Medications  ketorolac (TORADOL) 30 MG/ML injection 30 mg (30 mg Intramuscular Given 10/29/15 1037)     ____________________________________________   INITIAL IMPRESSION / ASSESSMENT AND PLAN / ED COURSE  Pertinent labs & imaging results that were available during my care of the patient were reviewed by me and considered in my medical decision making (see chart for details).  Clinical Course    Patient's diagnosis is consistent with Left knee sprain, effusion of left knee and arthritis of left knee. Patient will be discharged home with prescriptions for naproxen to take as directed. Patient was placed in an Ace wrap and he states he has a walker at home that he may use assist with ambulation. Patient is to follow up with Dr. Martha ClanKrasinski in orthopedics this week for follow-up as needed. Patient is given ED precautions to  return to the ED for any worsening or new symptoms.    ____________________________________________  FINAL CLINICAL IMPRESSION(S) / ED DIAGNOSES  Final diagnoses:  Left knee sprain, initial encounter  Effusion of left knee  Arthritis of left knee      NEW MEDICATIONS STARTED DURING THIS VISIT:  New Prescriptions   NAPROXEN (NAPROSYN) 500 MG TABLET    Take 1 tablet (500 mg total) by mouth 2 (two) times daily with a meal.         Hope Pigeon, PA-C 10/29/15 1050    Sharyn Creamer, MD 10/29/15 1607

## 2015-10-29 NOTE — Discharge Instructions (Signed)
Keep knee elevated when not walking.   Limit weight bearing to a minimum for a few days.   Complete light range of motion exercises with the knee to improve mobility.

## 2015-10-29 NOTE — ED Triage Notes (Signed)
Left knee "gave away" on patient yesterday when getting out of truck.  C/O pain with bending knee.

## 2015-12-25 IMAGING — CT CT HEAD WITHOUT CONTRAST
1 series · 16 of 30 positions shown, 20 images · non-contrast
Comparison: None.

CLINICAL DATA: Dizziness and unsteady gait since last night.
Symptoms are worse with movement.

EXAM:
CT HEAD WITHOUT CONTRAST
TECHNIQUE: Contiguous axial images were obtained from the base of the skull
through the vertex without intravenous contrast.

[Series 2: head wo · axial · 0.43mm/px · z∈[+33,+164]mm · 16 of 30 slices shown, 20 images]
[im 2/30  brain]
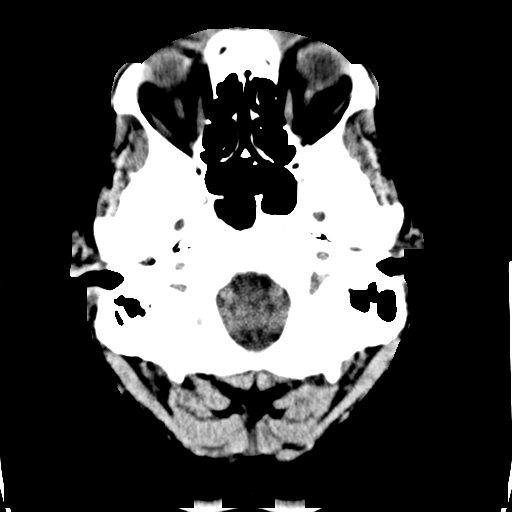
[im 2/30  bone]
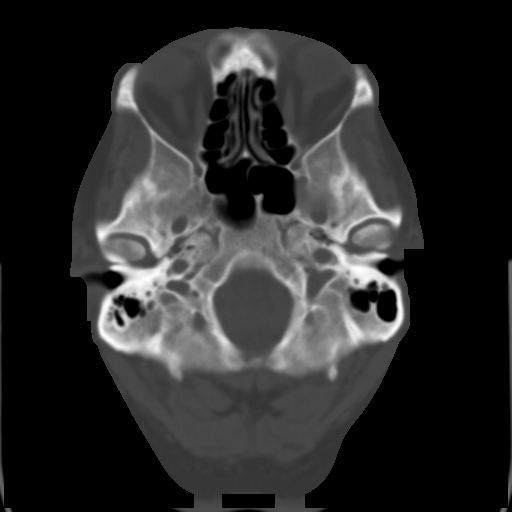
[im 4/30  brain]
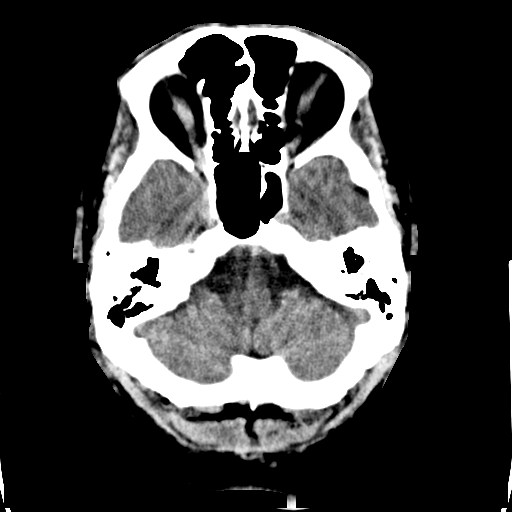
[im 6/30  brain]
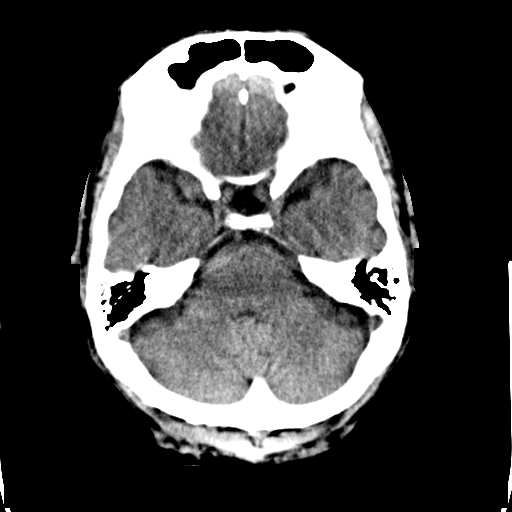
[im 8/30  brain]
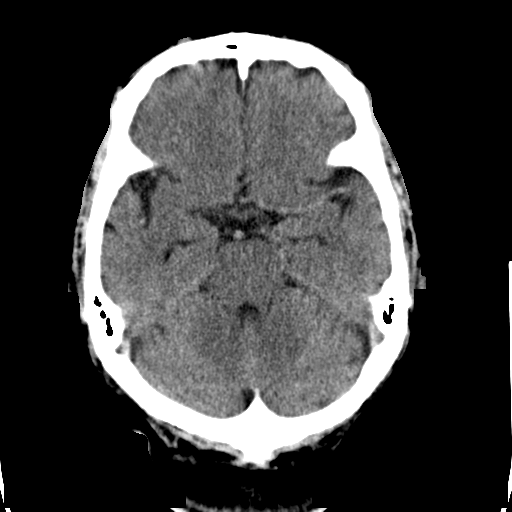
[im 9/30  brain]
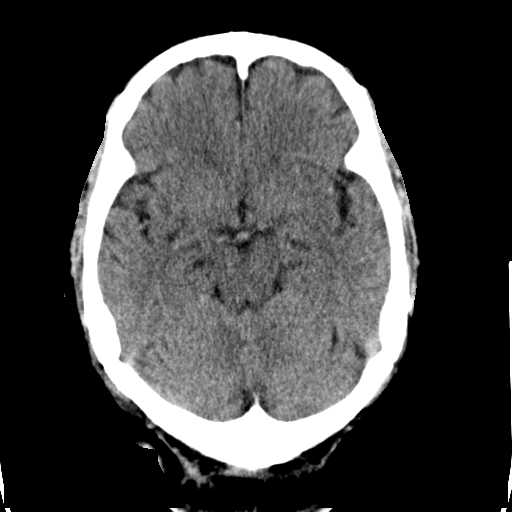
[im 9/30  bone]
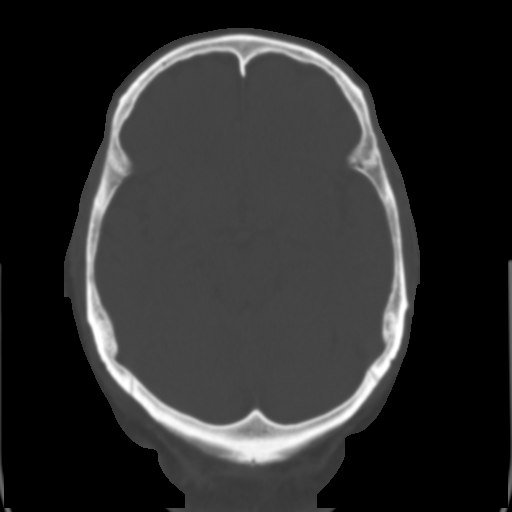
[im 11/30  brain]
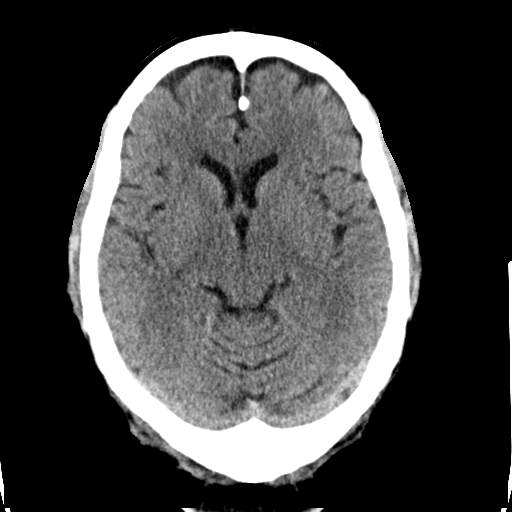
[im 13/30  brain]
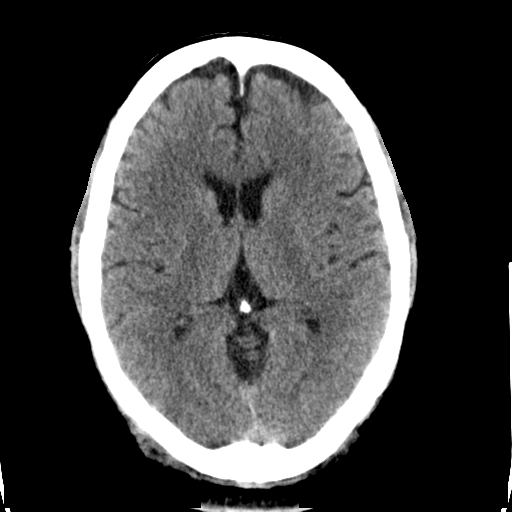
[im 15/30  brain]
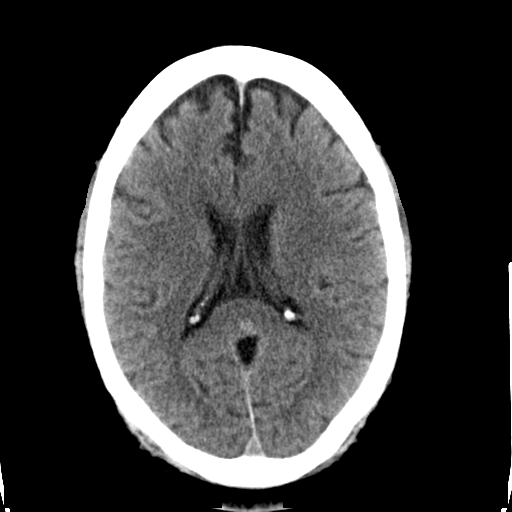
[im 16/30  brain]
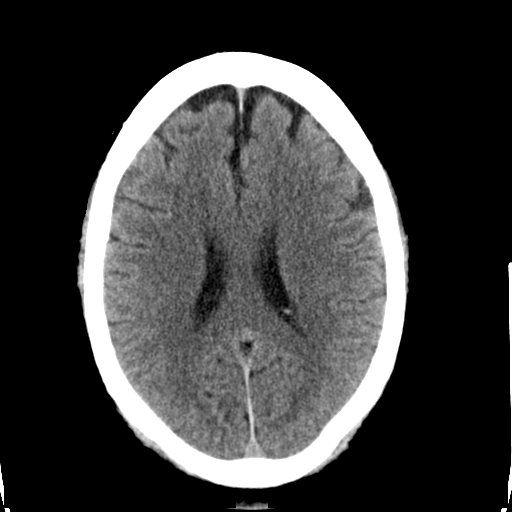
[im 16/30  bone]
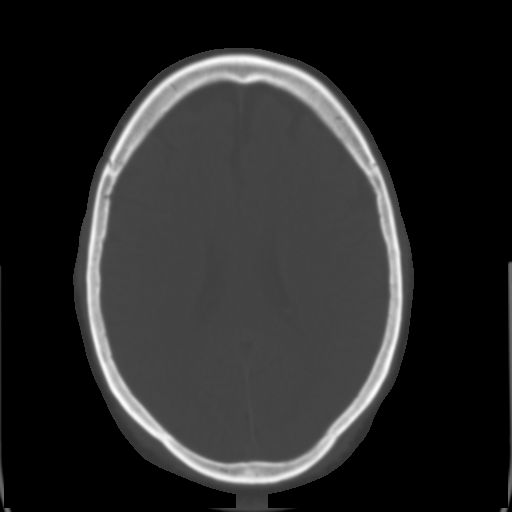
[im 18/30  brain]
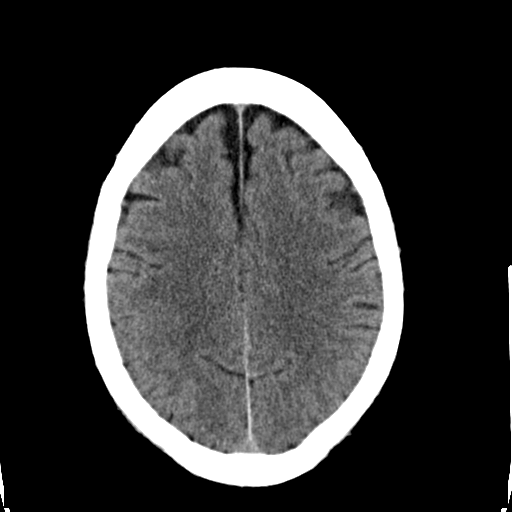
[im 20/30  brain]
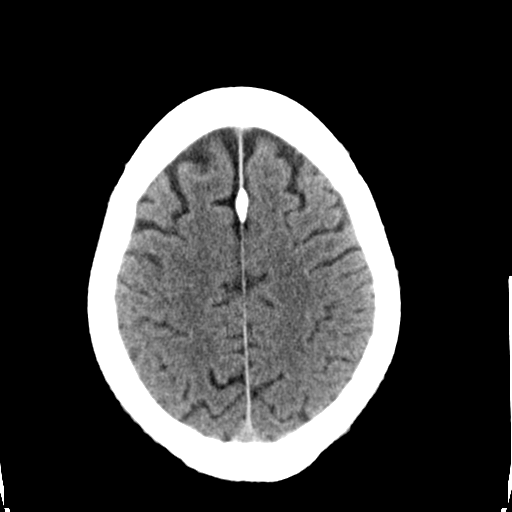
[im 22/30  brain]
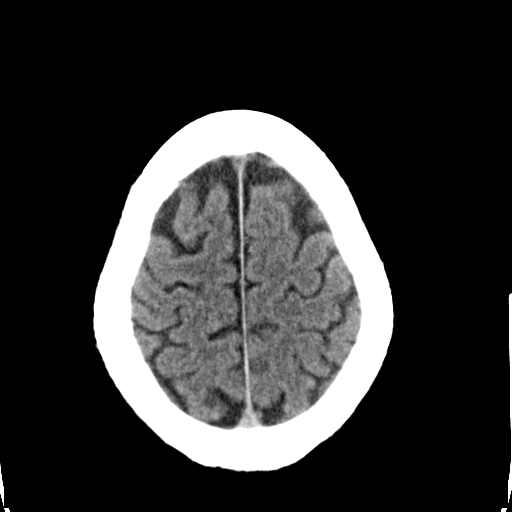
[im 23/30  brain]
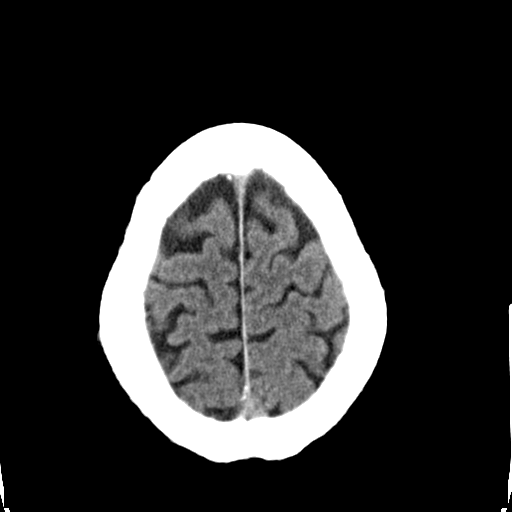
[im 23/30  bone]
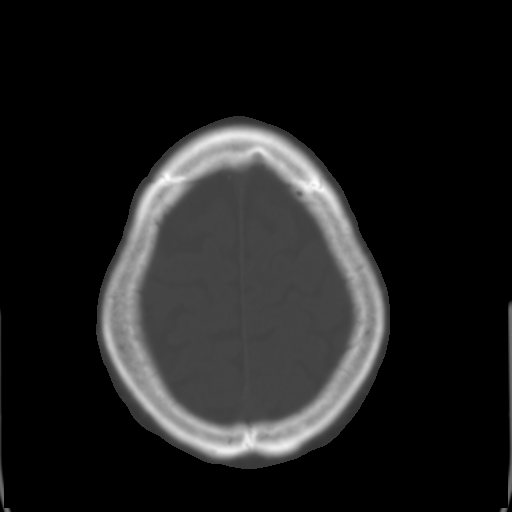
[im 25/30  brain]
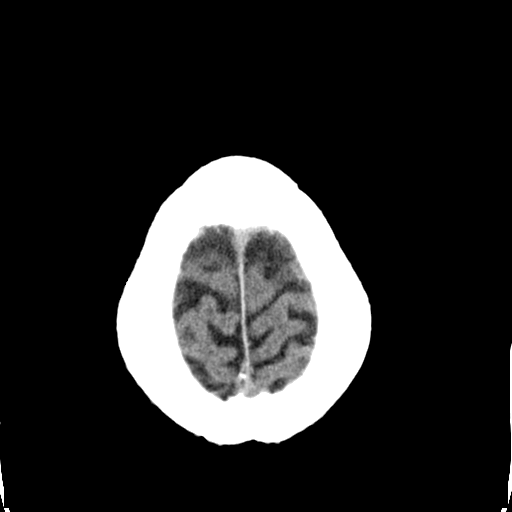
[im 27/30  brain]
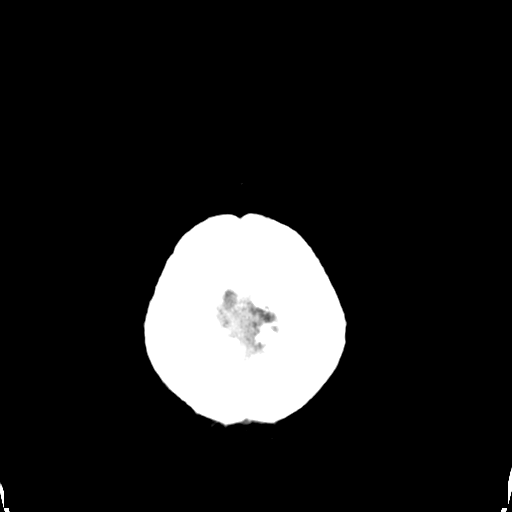
[im 29/30  brain]
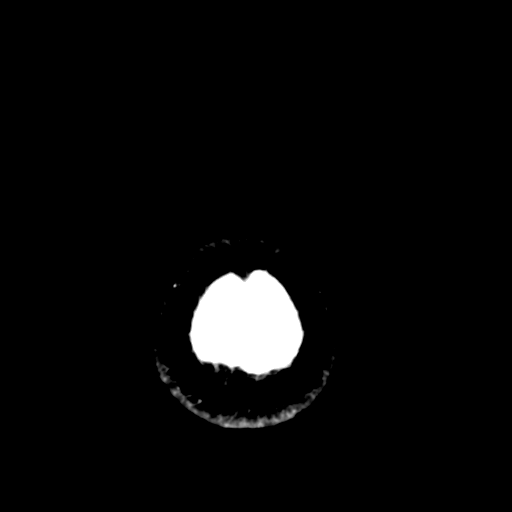

[16 of 30 positions shown; findings below may reference images not displayed]

FINDINGS: No acute cortical infarct, hemorrhage, or mass lesion is present.
The ventricles are of normal size. No significant extra-axial fluid
collection is evident. The paranasal sinuses and mastoid air cells
are clear. The calvarium is intact.
IMPRESSION: Negative CT of the head.

## 2017-07-06 ENCOUNTER — Encounter: Payer: Self-pay | Admitting: Emergency Medicine

## 2017-07-06 ENCOUNTER — Emergency Department: Payer: Medicare Other

## 2017-07-06 ENCOUNTER — Emergency Department
Admission: EM | Admit: 2017-07-06 | Discharge: 2017-07-06 | Disposition: A | Discharge status: Home / Self Care | Payer: Medicare Other | Attending: Emergency Medicine | Admitting: Emergency Medicine

## 2017-07-06 ENCOUNTER — Other Ambulatory Visit: Payer: Self-pay

## 2017-07-06 DIAGNOSIS — Z79899 Other long term (current) drug therapy: Secondary | ICD-10-CM | POA: Diagnosis not present

## 2017-07-06 DIAGNOSIS — R270 Ataxia, unspecified: Secondary | ICD-10-CM | POA: Insufficient documentation

## 2017-07-06 DIAGNOSIS — Z7982 Long term (current) use of aspirin: Secondary | ICD-10-CM | POA: Insufficient documentation

## 2017-07-06 DIAGNOSIS — I10 Essential (primary) hypertension: Secondary | ICD-10-CM | POA: Diagnosis not present

## 2017-07-06 DIAGNOSIS — R42 Dizziness and giddiness: Secondary | ICD-10-CM | POA: Diagnosis present

## 2017-07-06 DIAGNOSIS — E782 Mixed hyperlipidemia: Secondary | ICD-10-CM | POA: Insufficient documentation

## 2017-07-06 LAB — BASIC METABOLIC PANEL WITH GFR
Anion gap: 9 (ref 5–15)
BUN: 14 mg/dL (ref 6–20)
CO2: 25 mmol/L (ref 22–32)
Calcium: 9 mg/dL (ref 8.9–10.3)
Chloride: 104 mmol/L (ref 101–111)
Creatinine, Ser: 1.06 mg/dL (ref 0.61–1.24)
GFR calc Af Amer: >60 mL/min (ref >60)
GFR calc non Af Amer: >60 mL/min (ref >60)
Glucose, Bld: 102 mg/dL — ABNORMAL HIGH (ref 65–99)
Potassium: 3.8 mmol/L (ref 3.5–5.1)
Sodium: 138 mmol/L (ref 135–145)

## 2017-07-06 LAB — CBC
HEMATOCRIT: 37.4 % — AB (ref 40.0–52.0)
Hemoglobin: 12.3 g/dL — ABNORMAL LOW (ref 13.0–18.0)
MCH: 27.9 pg (ref 26.0–34.0)
MCHC: 33 g/dL (ref 32.0–36.0)
MCV: 84.5 fL (ref 80.0–100.0)
Platelets: 194 10*3/uL (ref 150–440)
RBC: 4.43 MIL/uL (ref 4.40–5.90)
RDW: 14.3 % (ref 11.5–14.5)
WBC: 4.3 10*3/uL (ref 3.8–10.6)

## 2017-07-06 LAB — URINALYSIS, COMPLETE (UACMP) WITH MICROSCOPIC
BACTERIA UA: NONE SEEN
Bilirubin Urine: NEGATIVE
Glucose, UA: NEGATIVE mg/dL
Ketones, ur: NEGATIVE mg/dL
Nitrite: NEGATIVE
Protein, ur: NEGATIVE mg/dL
SPECIFIC GRAVITY, URINE: 1.014 (ref 1.005–1.030)
pH: 5 (ref 5.0–8.0)

## 2017-07-06 LAB — GLUCOSE, CAPILLARY: Glucose-Capillary: 93 mg/dL (ref 65–99)

## 2017-07-06 NOTE — ED Notes (Signed)
Pt taken to MRI  

## 2017-07-06 NOTE — ED Provider Notes (Signed)
[facility] Emergency Department Provider Note  ____________________________________________   First MD Initiated Contact with Patient 07/06/17 1501     (approximate)  I have reviewed the triage vital signs and the nursing notes.   HISTORY  Chief Complaint Dizziness and Weakness    HPI Charles Shelton is a 83 y.o. male with medical history as listed below presents for evaluation of acute onset mild dizziness and moderate difficulty with ambulation and loss of balance.  He reports that he was watching TV last night and felt normal until he got up to go to bed.  When he did so he almost fell down and was having difficulty with ambulation and feeling lightheaded and dizzy.  He assumed it would be better in the morning and he went to bed and slept with no difficulties but when he got up this morning he was still having difficulty with ambulation.  The dizziness is improved somewhat but he still has to focus in order to not fall over while he is ambulating.  He has no numbness, tingling, nor weakness in any of his extremities.  He is having no difficulty speaking or finding his words.  He does not have a headache and no visual changes.  The onset of the symptoms was more than 12 hours ago and was acute, nothing is making it better and it is worse when he tries to ambulate.  He denies nausea, vomiting, chest pain or shortness of breath, abdominal pain, dysuria, and fever/chills.  He had similar symptoms in the past when he thought he was dehydrated but these are more pronounced.  He has not had any difficulty eating and drinking recently and reports that he is hungry right now.  Past Medical History:  Diagnosis Date  . HBP (high blood pressure)   . High cholesterol     There are no active problems to display for this patient.   Past Surgical History:  Procedure Laterality Date  . COLONOSCOPY WITH PROPOFOL N/A 03/27/2015   Procedure: COLONOSCOPY WITH PROPOFOL;   Surgeon: Scot Jun, MD;  Location: [facility] ENDOSCOPY;  Service: Endoscopy;  Laterality: N/A;  . FOOT SURGERY      Prior to Admission medications   Medication Sig Start Date End Date Taking? Authorizing Provider  aspirin 81 MG tablet Take 81 mg by mouth daily.    [provider]  aspirin EC 81 MG tablet Take by mouth.    [provider]  CRESTOR 5 MG tablet  05/03/13   [provider]  hydrochlorothiazide (HYDRODIURIL) 25 MG tablet  04/22/13   [provider]  hydrochlorothiazide (HYDRODIURIL) 25 MG tablet  04/22/13   [provider]  Multiple Vitamin (MULTI-VITAMINS) TABS Take by mouth.    [provider]  naproxen (NAPROSYN) 500 MG tablet Take 1 tablet (500 mg total) by mouth 2 (two) times daily with a meal. 10/29/15   Hagler, Jami L, PA-C  rosuvastatin (CRESTOR) 5 MG tablet  05/03/13   [provider]  valsartan-hydrochlorothiazide (DIOVAN-HCT) 160-12.5 MG per tablet  05/19/13   [provider]  valsartan-hydrochlorothiazide (DIOVAN-HCT) 160-12.5 MG per tablet Take by mouth. 07/18/13 08/17/13  [provider]    Allergies Patient has no known allergies.  No family history on file.  Social History Social History   Tobacco Use  . Smoking status: Never Smoker  . Smokeless tobacco: Never Used  Substance Use Topics  . Alcohol use: No  . Drug use: Not on file  Review of Systems Constitutional: No fever/chills Eyes: No visual changes. ENT: No sore throat. Cardiovascular: Denies chest pain. Respiratory: Denies shortness of breath. Gastrointestinal: No abdominal pain.  No nausea, no vomiting.  No diarrhea.  No constipation. Genitourinary: Negative for dysuria. Musculoskeletal: Negative for neck pain.  Negative for back pain. Integumentary: Negative for rash. Neurological: Acute onset dizziness and difficulty with ambulation more than 12 hours ago.  No focal numbness/tingling nor  weakness.   ____________________________________________   PHYSICAL EXAM:  VITAL SIGNS: ED Triage Vitals  Enc Vitals Group     BP 07/06/17 1035 133/68     Pulse Rate 07/06/17 1035 61     Resp 07/06/17 1035 16     Temp 07/06/17 1035 98.4 F (36.9 C)     Temp Source 07/06/17 1035 Oral     SpO2 07/06/17 1035 99 %     Weight 07/06/17 1035 97.5 kg (215 lb)     Height 07/06/17 1035 1.803 m ( )     Head Circumference --      Peak Flow --      Pain Score 07/06/17 1048 0     Pain Loc --      Pain Edu? --      Excl. in GC? --     Constitutional: Alert and oriented. Well appearing and in no acute distress. Eyes: Conjunctivae are normal. PERRL. EOMI. no nystagmus Head: Atraumatic. Nose: No congestion/rhinnorhea. Mouth/Throat: Mucous membranes are moist. Neck: No stridor.  No meningeal signs.   Cardiovascular: Normal rate, regular rhythm. Good peripheral circulation. Grossly normal heart sounds. Respiratory: Normal respiratory effort.  No retractions. Lungs CTAB. Gastrointestinal: Soft and nontender. No distention.  Musculoskeletal: No lower extremity tenderness nor edema. No gross deformities of extremities. Neurologic:  Normal speech and language. Normal strength in major muscle groups.  Negative pronator drift, negative Romberg.  However when the patient is standing with his eyes shut he starts to sway from side to side but he does not completely lose balance.  Upon ambulation, he takes a wide-based stance and takes small shuffling steps because he said if he does not Skin:  Skin is warm, dry and intact. No rash noted. Psychiatric: Mood and affect are normal. Speech and behavior are normal.  ____________________________________________   LABS (all labs ordered are listed, but only abnormal results are displayed)  Labs Reviewed  BASIC METABOLIC PANEL - Abnormal; Notable for the following components:      Result Value   Glucose, Bld 102 (*)    All other components within  normal limits  CBC - Abnormal; Notable for the following components:   Hemoglobin 12.3 (*)    HCT 37.4 (*)    All other components within normal limits  URINALYSIS, COMPLETE (UACMP) WITH MICROSCOPIC - Abnormal; Notable for the following components:   Color, Urine YELLOW (*)    APPearance CLEAR (*)    Hgb urine dipstick MODERATE (*)    Leukocytes, UA TRACE (*)    All other components within normal limits  GLUCOSE, CAPILLARY  CBG MONITORING, ED   ____________________________________________  EKG  ED ECG REPORT I, Loleta Rose, the attending physician, personally viewed and interpreted this ECG.  Date: 07/06/2017 EKG Time: 10:39 AM Rate: 58 Rhythm: Sinus bradycardia with sinus arrhythmia QRS Axis: normal Intervals: normal ST/T Wave abnormalities: normal Narrative Interpretation: no evidence of acute ischemia  ____________________________________________  RADIOLOGY   ED MD interpretation:  No acute or subacute abnormalities on MRI  Official radiology report(s): Mr Angiogram Head  Wo Contrast  Result Date: 07/06/2017 CLINICAL DATA:  Dizziness, gait imbalance. Similar symptoms with dehydration. History of hypertension and hypercholesterolemia. EXAM: MRI HEAD WITHOUT CONTRAST MRA HEAD WITHOUT CONTRAST TECHNIQUE: Multiplanar, multiecho pulse sequences of the brain and surrounding structures were obtained without intravenous contrast. Angiographic images of the head were obtained using MRA technique without contrast. COMPARISON:  MRI/MRA head February 16, 2014 FINDINGS: MRI HEAD FINDINGS INTRACRANIAL CONTENTS: No reduced diffusion to suggest acute ischemia. No susceptibility artifact to suggest hemorrhage. The ventricles and sulci are normal for patient's age. Stable patchy supratentorial white matter FLAIR T2 hyperintensities compatible with mild chronic small vessel ischemic disease, less than expected for age. No suspicious parenchymal signal, masses, mass effect. No abnormal  extra-axial fluid collections. No extra-axial masses. VASCULAR: Normal major intracranial vascular flow voids present at skull base. SKULL AND UPPER CERVICAL SPINE: No abnormal sellar expansion. No suspicious calvarial bone marrow signal. Craniocervical junction maintained. SINUSES/ORBITS: The mastoid air-cells and included paranasal sinuses are well-aerated.The included ocular globes and orbital contents are non-suspicious. Status post bilateral ocular lens implants. OTHER: 1 cm nasopharyngeal Thornwaldt cyst. MRA HEAD FINDINGS ANTERIOR CIRCULATION: Normal flow related enhancement of the included cervical, petrous, cavernous and supraclinoid internal carotid arteries. Patent anterior communicating artery. Patent anterior and middle cerebral arteries, including distal segments. No large vessel occlusion, flow limiting stenosis, aneurysm. POSTERIOR CIRCULATION: Codominant vertebral arteries. Basilar artery is patent, with normal flow related enhancement of the main branch vessels. Patent posterior cerebral arteries. No large vessel occlusion, flow limiting stenosis,  aneurysm. ANATOMIC VARIANTS: Aplastic RIGHT A1 segment. Source images and MIP images were reviewed. IMPRESSION: 1. Normal noncontrast MRI of the head for age. 2. Normal noncontrast MRA of the head. Electronically Signed   By: Awilda Metro M.D.   On: 07/06/2017 18:03   Mr Brain Wo Contrast  Result Date: 07/06/2017 CLINICAL DATA:  Dizziness, gait imbalance. Similar symptoms with dehydration. History of hypertension and hypercholesterolemia. EXAM: MRI HEAD WITHOUT CONTRAST MRA HEAD WITHOUT CONTRAST TECHNIQUE: Multiplanar, multiecho pulse sequences of the brain and surrounding structures were obtained without intravenous contrast. Angiographic images of the head were obtained using MRA technique without contrast. COMPARISON:  MRI/MRA head February 16, 2014 FINDINGS: MRI HEAD FINDINGS INTRACRANIAL CONTENTS: No reduced diffusion to suggest acute  ischemia. No susceptibility artifact to suggest hemorrhage. The ventricles and sulci are normal for patient's age. Stable patchy supratentorial white matter FLAIR T2 hyperintensities compatible with mild chronic small vessel ischemic disease, less than expected for age. No suspicious parenchymal signal, masses, mass effect. No abnormal extra-axial fluid collections. No extra-axial masses. VASCULAR: Normal major intracranial vascular flow voids present at skull base. SKULL AND UPPER CERVICAL SPINE: No abnormal sellar expansion. No suspicious calvarial bone marrow signal. Craniocervical junction maintained. SINUSES/ORBITS: The mastoid air-cells and included paranasal sinuses are well-aerated.The included ocular globes and orbital contents are non-suspicious. Status post bilateral ocular lens implants. OTHER: 1 cm nasopharyngeal Thornwaldt cyst. MRA HEAD FINDINGS ANTERIOR CIRCULATION: Normal flow related enhancement of the included cervical, petrous, cavernous and supraclinoid internal carotid arteries. Patent anterior communicating artery. Patent anterior and middle cerebral arteries, including distal segments. No large vessel occlusion, flow limiting stenosis, aneurysm. POSTERIOR CIRCULATION: Codominant vertebral arteries. Basilar artery is patent, with normal flow related enhancement of the main branch vessels. Patent posterior cerebral arteries. No large vessel occlusion, flow limiting stenosis,  aneurysm. ANATOMIC VARIANTS: Aplastic RIGHT A1 segment. Source images and MIP images were reviewed. IMPRESSION: 1. Normal noncontrast MRI of the head for age. 2. Normal noncontrast MRA of  the head. Electronically Signed   By: Awilda Metro M.D.   On: 07/06/2017 18:03    ____________________________________________   PROCEDURES  Critical Care performed: No   Procedure(s) performed:   Procedures   ____________________________________________   INITIAL IMPRESSION / ASSESSMENT AND PLAN / ED COURSE  As  part of my medical decision making, I reviewed the following data within the electronic MEDICAL RECORD NUMBER Nursing notes reviewed and incorporated, Labs reviewed , EKG interpreted  and Old chart reviewed    Differential diagnosis includes, but is not limited to, CVA/TIA, metabolic or elect light abnormality, poor posterior cerebral circulation, acute infection, intra-cerebral bleeding.  The patient is well-appearing in no acute distress but he has markedly abnormal gait with ataxia which is reportedly acute in onset but occurred more than 12 hours ago.  He is not within the window for TPA.  I suspect he may have had a mild CVA and I will further evaluate with an MR brain as well as MR angiogram head to evaluate his posterior circulation.  He is in no distress and I explained my plan to him that he understands and agrees.  CBC is within normal limits, blood glucose is normal, there is some hemoglobin his urine but no evidence of acute infection, basic metabolic panel is within normal limits.  Vital signs are stable.  Clinical Course as of Jul 07 1830  Tue Jul 06, 2017  1717 Patient next up for MRI   [CF]  1813 Reassuring MR imaging with no sign of acute or subacute abnormality.  MR Brain Wo Contrast [CF]  1830 Patient thrilled with the normal MRI results and eager to go home and eat.  I gave my usual and customary return precautions.    [CF]    Clinical Course User Index [CF] Loleta Rose, MD    ____________________________________________  FINAL CLINICAL IMPRESSION(S) / ED DIAGNOSES  Final diagnoses:  Ataxia     MEDICATIONS GIVEN DURING THIS VISIT:  Medications - No data to display   ED Discharge Orders    None       Note:  This document was prepared using Dragon voice recognition software and may include unintentional dictation errors.    Loleta Rose, MD 07/06/17 [phone]

## 2017-07-06 NOTE — Discharge Instructions (Signed)
Your workup in the Emergency Department today was reassuring.  We did not find any specific abnormalities, including on your lab work and MRI of your brain.  We recommend you drink plenty of fluids, take your regular medications and/or any new ones prescribed today, and follow up with the doctor(s) listed in these documents as recommended.  Return to the Emergency Department if you develop new or worsening symptoms that concern you.

## 2017-07-06 NOTE — ED Notes (Signed)
First Nurse Note:  Patient here from [facility] via WC with dizziness and "swimmy headedness" starting last PM at approx. 10 PM, states he was sitting watching TV when it occurred. States "it's worse today".  Alert and oriented, speech clear.

## 2017-07-06 NOTE — ED Triage Notes (Signed)
Pt arrived via POV with reports of dizziness, feeling swimmy headed and loss of balance. Pt denies HA, pt states he had similar sxs a few years ago when he was dehydrated.  Denies any falls.

## 2021-12-05 ENCOUNTER — Ambulatory Visit (LOCAL_COMMUNITY_HEALTH_CENTER): Payer: Medicare PPO

## 2021-12-05 DIAGNOSIS — Z719 Counseling, unspecified: Secondary | ICD-10-CM

## 2021-12-05 DIAGNOSIS — Z23 Encounter for immunization: Secondary | ICD-10-CM | POA: Diagnosis not present

## 2021-12-05 NOTE — Progress Notes (Signed)
  Are you feeling sick today? No   Have you ever received a dose of COVID-19 Vaccine? (Pfizer, Janssen, Moderna, Novavax, Other) Yes  If yes, which vaccine and how many doses?   PFIZER, 4   Did you bring the vaccination record card or other documentation?  Yes   Do you have a health condition or are undergoing treatment that makes you moderately or severely immunocompromised? This would include, but not be limited to: cancer, HIV, organ transplant, immunosuppressive therapy/high-dose corticosteroids, or moderate/severe primary immunodeficiency.  No  Have you received COVID-19 vaccine before or during hematopoietic cell transplant (HCT) or CAR-T-cell therapies? No  Have you ever had an allergic reaction to: (This would include a severe allergic reaction or a reaction that caused hives, swelling, or respiratory distress, including wheezing.) A component of a COVID-19 vaccine or a previous dose of COVID-19 vaccine? No   Have you ever had an allergic reaction to another vaccine (other thanCOVID-19 vaccine) or an injectable medication? (This would include a severe allergic reaction or a reaction that caused hives, swelling, or respiratory distress, including wheezing.)   No    Do you have a history of any of the following:  Myocarditis or Pericarditis No  Dermal fillers:  No  Multisystem Inflammatory Syndrome (MIS-C or MIS-A)? No  COVID-19 disease within the past 3 months? No  Vaccinated with monkeypox vaccine in the last 4 weeks? No  Eligible and administered Comirnaty 12y+2023-2024, monitored, tolerated well. Verbalized understanding of VIS and NCIR copy. M.Aslynn Brunetti, LPN.
